# Patient Record
Sex: Female | Born: 2000 | Race: White | Hispanic: No | Marital: Single | Smoking: Never smoker
Health system: Southern US, Community
[De-identification: ages and names within clinical notes are randomized; demographics above are authoritative.]

## PROBLEM LIST (undated history)

## (undated) DIAGNOSIS — F419 Anxiety disorder, unspecified: Secondary | ICD-10-CM

## (undated) HISTORY — DX: Anxiety disorder, unspecified: F41.9

## (undated) HISTORY — PX: WISDOM TOOTH EXTRACTION: SHX21

---
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
## 2018-01-09 DIAGNOSIS — J029 Acute pharyngitis, unspecified: Secondary | ICD-10-CM | POA: Diagnosis not present

## 2018-02-15 DIAGNOSIS — R5383 Other fatigue: Secondary | ICD-10-CM | POA: Diagnosis not present

## 2018-02-15 DIAGNOSIS — J029 Acute pharyngitis, unspecified: Secondary | ICD-10-CM | POA: Diagnosis not present

## 2018-03-29 DIAGNOSIS — Z0000 Encounter for general adult medical examination without abnormal findings: Secondary | ICD-10-CM | POA: Diagnosis not present

## 2018-04-05 DIAGNOSIS — Z0000 Encounter for general adult medical examination without abnormal findings: Secondary | ICD-10-CM | POA: Diagnosis not present

## 2018-06-19 DIAGNOSIS — J069 Acute upper respiratory infection, unspecified: Secondary | ICD-10-CM | POA: Diagnosis not present

## 2018-06-19 DIAGNOSIS — H608X1 Other otitis externa, right ear: Secondary | ICD-10-CM | POA: Diagnosis not present

## 2018-09-26 DIAGNOSIS — F331 Major depressive disorder, recurrent, moderate: Secondary | ICD-10-CM | POA: Diagnosis not present

## 2018-09-28 DIAGNOSIS — N390 Urinary tract infection, site not specified: Secondary | ICD-10-CM | POA: Diagnosis not present

## 2018-10-03 DIAGNOSIS — F331 Major depressive disorder, recurrent, moderate: Secondary | ICD-10-CM | POA: Diagnosis not present

## 2018-10-17 DIAGNOSIS — F410 Panic disorder [episodic paroxysmal anxiety] without agoraphobia: Secondary | ICD-10-CM | POA: Diagnosis not present

## 2018-10-17 DIAGNOSIS — F331 Major depressive disorder, recurrent, moderate: Secondary | ICD-10-CM | POA: Diagnosis not present

## 2018-10-17 DIAGNOSIS — F330 Major depressive disorder, recurrent, mild: Secondary | ICD-10-CM | POA: Diagnosis not present

## 2018-10-17 DIAGNOSIS — F411 Generalized anxiety disorder: Secondary | ICD-10-CM | POA: Diagnosis not present

## 2018-10-24 DIAGNOSIS — F331 Major depressive disorder, recurrent, moderate: Secondary | ICD-10-CM | POA: Diagnosis not present

## 2018-11-02 DIAGNOSIS — F331 Major depressive disorder, recurrent, moderate: Secondary | ICD-10-CM | POA: Diagnosis not present

## 2018-11-07 DIAGNOSIS — F331 Major depressive disorder, recurrent, moderate: Secondary | ICD-10-CM | POA: Diagnosis not present

## 2018-11-13 DIAGNOSIS — F410 Panic disorder [episodic paroxysmal anxiety] without agoraphobia: Secondary | ICD-10-CM | POA: Diagnosis not present

## 2018-11-13 DIAGNOSIS — F411 Generalized anxiety disorder: Secondary | ICD-10-CM | POA: Diagnosis not present

## 2018-11-13 DIAGNOSIS — F330 Major depressive disorder, recurrent, mild: Secondary | ICD-10-CM | POA: Diagnosis not present

## 2018-11-14 DIAGNOSIS — F331 Major depressive disorder, recurrent, moderate: Secondary | ICD-10-CM | POA: Diagnosis not present

## 2018-11-17 DIAGNOSIS — R109 Unspecified abdominal pain: Secondary | ICD-10-CM | POA: Diagnosis not present

## 2018-11-17 DIAGNOSIS — R1030 Lower abdominal pain, unspecified: Secondary | ICD-10-CM | POA: Diagnosis not present

## 2019-02-05 DIAGNOSIS — F419 Anxiety disorder, unspecified: Secondary | ICD-10-CM | POA: Diagnosis not present

## 2019-02-05 DIAGNOSIS — M79671 Pain in right foot: Secondary | ICD-10-CM | POA: Diagnosis not present

## 2019-02-26 DIAGNOSIS — M25571 Pain in right ankle and joints of right foot: Secondary | ICD-10-CM | POA: Diagnosis not present

## 2019-02-27 DIAGNOSIS — M25571 Pain in right ankle and joints of right foot: Secondary | ICD-10-CM | POA: Diagnosis not present

## 2019-03-02 DIAGNOSIS — M25571 Pain in right ankle and joints of right foot: Secondary | ICD-10-CM | POA: Diagnosis not present

## 2019-03-05 DIAGNOSIS — M25571 Pain in right ankle and joints of right foot: Secondary | ICD-10-CM | POA: Diagnosis not present

## 2019-03-08 ENCOUNTER — Other Ambulatory Visit: Payer: Self-pay | Admitting: Obstetrics & Gynecology

## 2019-03-26 DIAGNOSIS — F419 Anxiety disorder, unspecified: Secondary | ICD-10-CM | POA: Diagnosis not present

## 2019-03-26 DIAGNOSIS — Z309 Encounter for contraceptive management, unspecified: Secondary | ICD-10-CM | POA: Diagnosis not present

## 2019-04-13 DIAGNOSIS — Z01419 Encounter for gynecological examination (general) (routine) without abnormal findings: Secondary | ICD-10-CM | POA: Diagnosis not present

## 2019-04-13 DIAGNOSIS — Z118 Encounter for screening for other infectious and parasitic diseases: Secondary | ICD-10-CM | POA: Diagnosis not present

## 2019-04-13 DIAGNOSIS — Z30430 Encounter for insertion of intrauterine contraceptive device: Secondary | ICD-10-CM | POA: Diagnosis not present

## 2019-04-13 DIAGNOSIS — Z975 Presence of (intrauterine) contraceptive device: Secondary | ICD-10-CM | POA: Diagnosis not present

## 2019-04-13 DIAGNOSIS — Z124 Encounter for screening for malignant neoplasm of cervix: Secondary | ICD-10-CM | POA: Diagnosis not present

## 2019-09-21 DIAGNOSIS — F988 Other specified behavioral and emotional disorders with onset usually occurring in childhood and adolescence: Secondary | ICD-10-CM | POA: Diagnosis not present

## 2019-10-16 DIAGNOSIS — H66001 Acute suppurative otitis media without spontaneous rupture of ear drum, right ear: Secondary | ICD-10-CM | POA: Diagnosis not present

## 2019-10-16 DIAGNOSIS — Z23 Encounter for immunization: Secondary | ICD-10-CM | POA: Diagnosis not present

## 2019-10-16 DIAGNOSIS — J0140 Acute pansinusitis, unspecified: Secondary | ICD-10-CM | POA: Diagnosis not present

## 2019-11-09 DIAGNOSIS — F988 Other specified behavioral and emotional disorders with onset usually occurring in childhood and adolescence: Secondary | ICD-10-CM | POA: Diagnosis not present

## 2019-12-21 DIAGNOSIS — Z20828 Contact with and (suspected) exposure to other viral communicable diseases: Secondary | ICD-10-CM | POA: Diagnosis not present

## 2020-02-03 DIAGNOSIS — J029 Acute pharyngitis, unspecified: Secondary | ICD-10-CM | POA: Diagnosis not present

## 2020-02-03 DIAGNOSIS — Z03818 Encounter for observation for suspected exposure to other biological agents ruled out: Secondary | ICD-10-CM | POA: Diagnosis not present

## 2020-02-03 DIAGNOSIS — Z20828 Contact with and (suspected) exposure to other viral communicable diseases: Secondary | ICD-10-CM | POA: Diagnosis not present

## 2020-02-03 DIAGNOSIS — J309 Allergic rhinitis, unspecified: Secondary | ICD-10-CM | POA: Diagnosis not present

## 2020-02-20 DIAGNOSIS — Z0000 Encounter for general adult medical examination without abnormal findings: Secondary | ICD-10-CM | POA: Diagnosis not present

## 2020-02-26 ENCOUNTER — Telehealth: Payer: Self-pay | Admitting: Gastroenterology

## 2020-02-26 DIAGNOSIS — R1012 Left upper quadrant pain: Secondary | ICD-10-CM

## 2020-02-26 NOTE — Telephone Encounter (Signed)
[phone] patient father called inquiring about appointment..  Was unsure what to tell him

## 2020-02-26 NOTE — Telephone Encounter (Signed)
Received faxed copy of front of insurance card. [employer]. Member ID: [id number]. Didn't receive copy of back of new card. Previous [employer] card in chart. Called [employer] number on back of old card: [phone]. No PA needed for outpatient/imaging services.  Called pt, gave her phone number for [facility] to call them if she hasn't heard anything from them in next few days. SLF entered order for GI.

## 2020-02-26 NOTE — Telephone Encounter (Addendum)
REVIEWED. PT SEEN AND EXAMINED. Has lump on abdomen that comes and goes for past 2 mos. It hurts. PT DENIES FEVER, CHILLS, HEMATOCHEZIA, HEMATEMESIS, nausea, vomiting, melena, diarrhea, CHEST PAIN, SHORTNESS OF BREATH,  CHANGE IN BOWEL IN HABITS, constipation, problems swallowing,or heartburn or indigestion.  Pe: mild to moderate tenderness in epigastrium and LUQ, SKIN: NORMAL  PLAN: 1. CT ABDOMEN W/ IV OR ORAL CONTRAST IN [facility] TO ASSESS AREA. PT NEEDS CT ON Friday APR 2 OR APR 7. 2. CONSIDER SURGERY REFERRAL AFTER CT RESULTS KNOWN.

## 2020-02-26 NOTE — Telephone Encounter (Signed)
Called pt, she has insurance. Will fax copy of insurance card to office.

## 2020-02-26 NOTE — Telephone Encounter (Signed)
See other phone note

## 2020-03-13 ENCOUNTER — Other Ambulatory Visit: Payer: Self-pay

## 2020-03-13 ENCOUNTER — Ambulatory Visit
Admission: RE | Admit: 2020-03-13 | Discharge: 2020-03-13 | Disposition: A | Discharge status: Home / Self Care | Payer: BLUE CROSS/BLUE SHIELD | Source: Ambulatory Visit | Attending: Gastroenterology | Admitting: Gastroenterology

## 2020-03-13 DIAGNOSIS — R1012 Left upper quadrant pain: Secondary | ICD-10-CM

## 2020-06-26 DIAGNOSIS — M898X1 Other specified disorders of bone, shoulder: Secondary | ICD-10-CM | POA: Diagnosis not present

## 2020-06-26 DIAGNOSIS — S29012A Strain of muscle and tendon of back wall of thorax, initial encounter: Secondary | ICD-10-CM | POA: Diagnosis not present

## 2021-08-11 IMAGING — CT CT ABDOMEN W/O CM
1 of 2 series · 14 of 32 positions shown, 19 images · non-contrast
Comparison: None.

CLINICAL DATA: Abdominal pain. Left upper quadrant pain evaluate
for hernia.

EXAM:
CT ABDOMEN WITHOUT CONTRAST
TECHNIQUE: Multidetector CT imaging of the abdomen was performed following the
standard protocol without IV contrast.

[Series 2: abd w/(date) · axial · 0.59mm/px · z∈[+687,+897]mm · 14 of 49 slices shown, 19 images]
[im 4/49  soft-tissue]
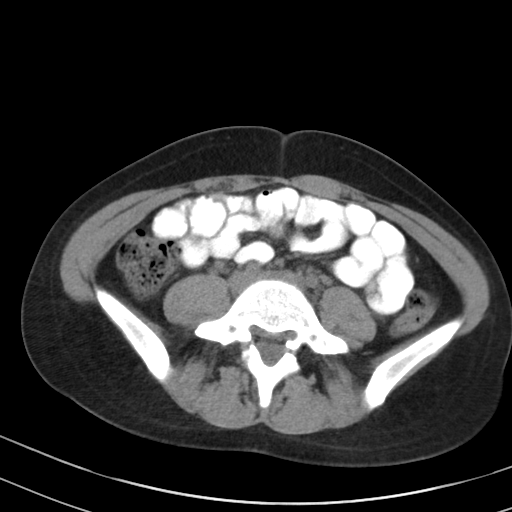
[im 4/49  bone]
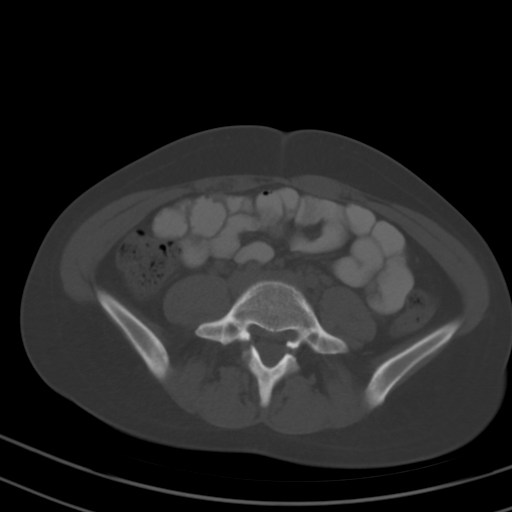
[im 7/49  soft-tissue]
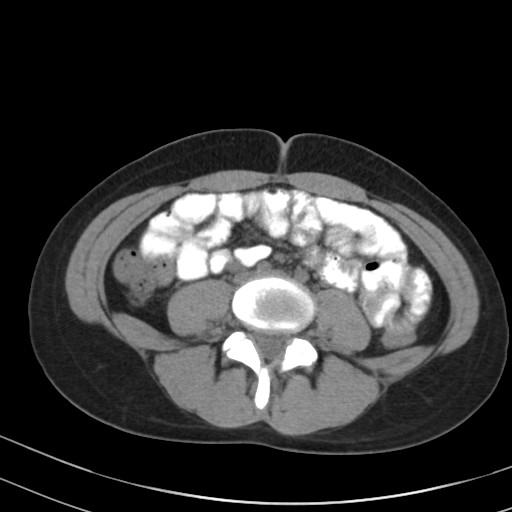
[im 10/49  soft-tissue]
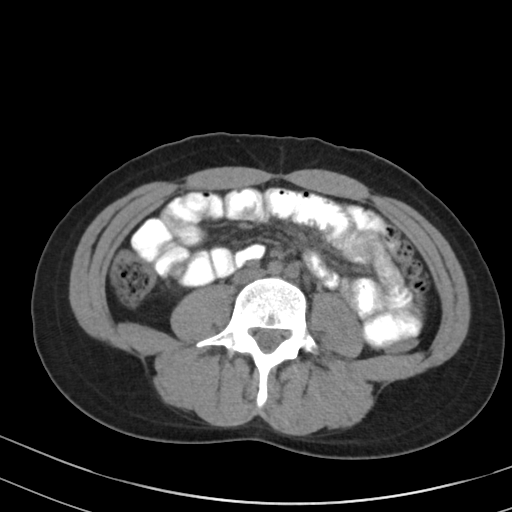
[im 16/49  soft-tissue]
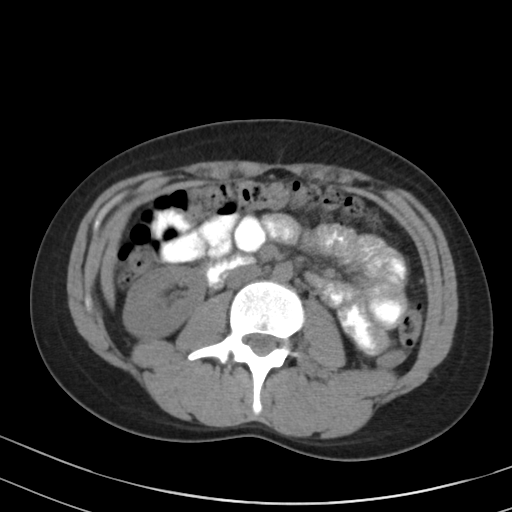
[im 19/49  soft-tissue]
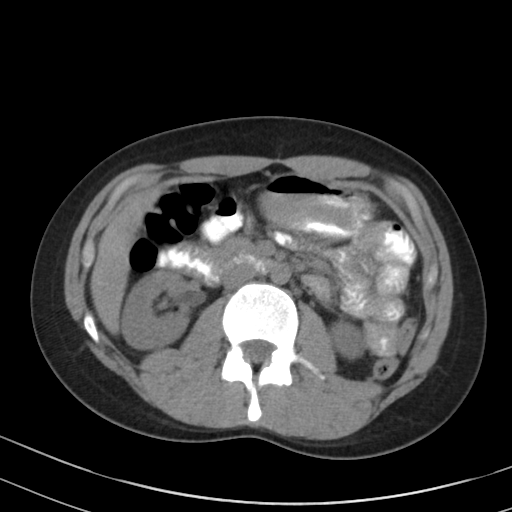
[im 22/49  soft-tissue]
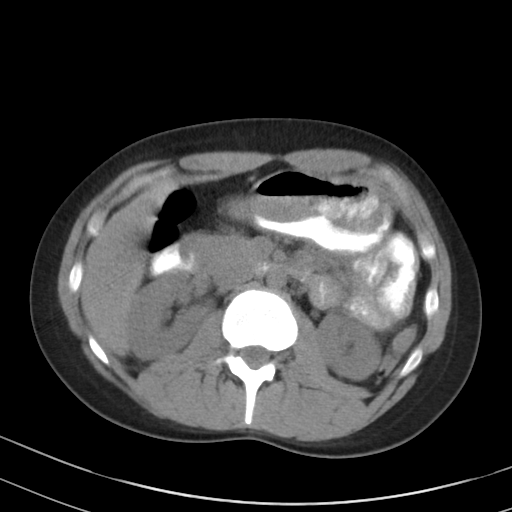
[im 25/49  soft-tissue]
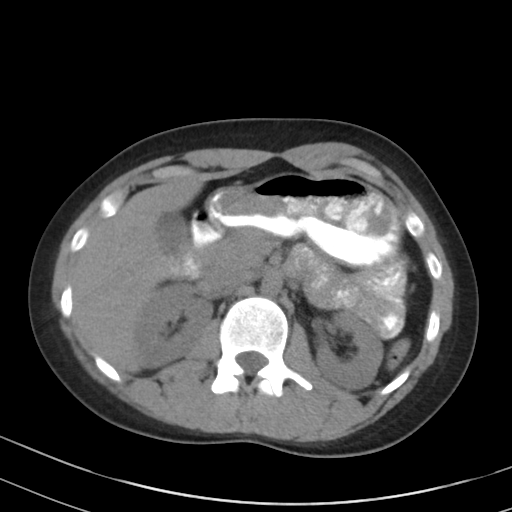
[im 28/49  soft-tissue]
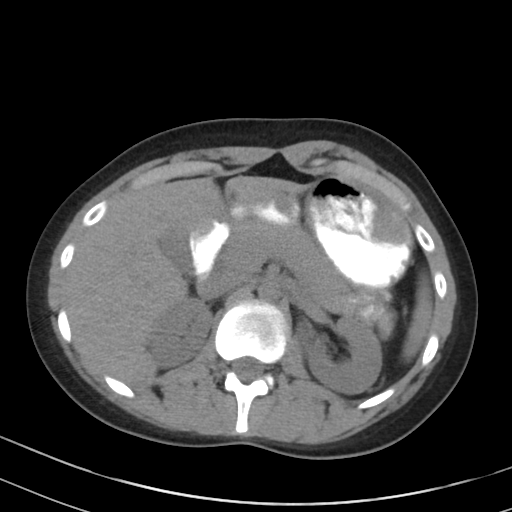
[im 31/49  soft-tissue]
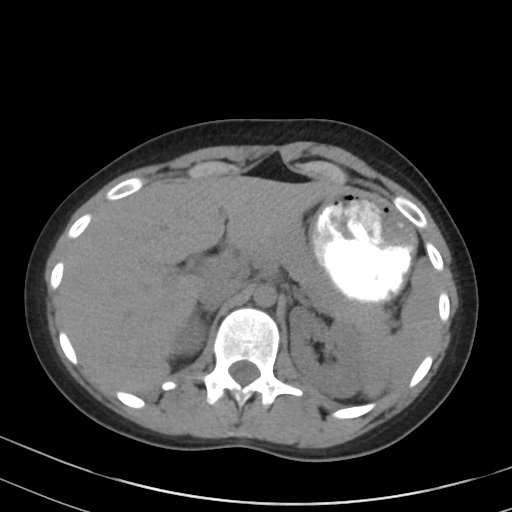
[im 31/49  bone]
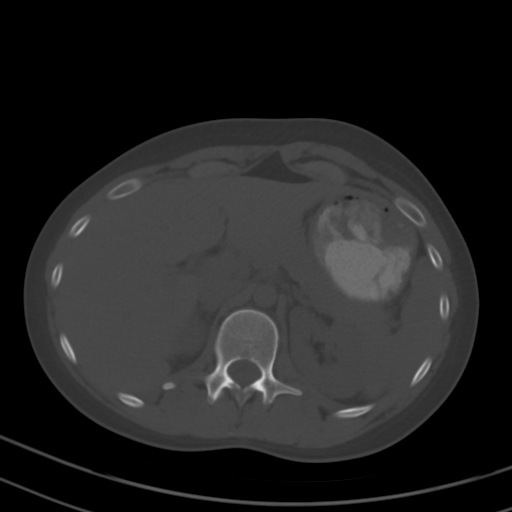
[im 34/49  soft-tissue]
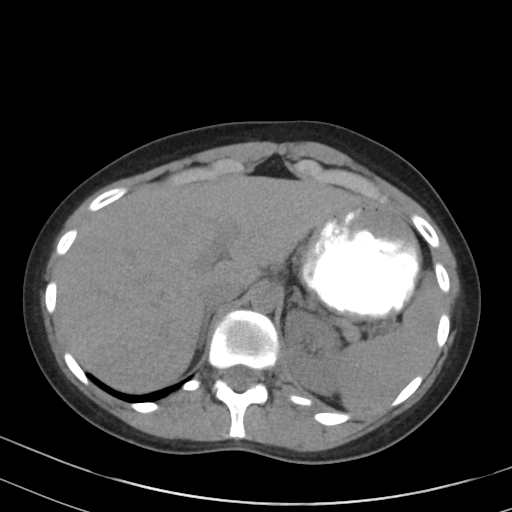
[im 37/49  lung]
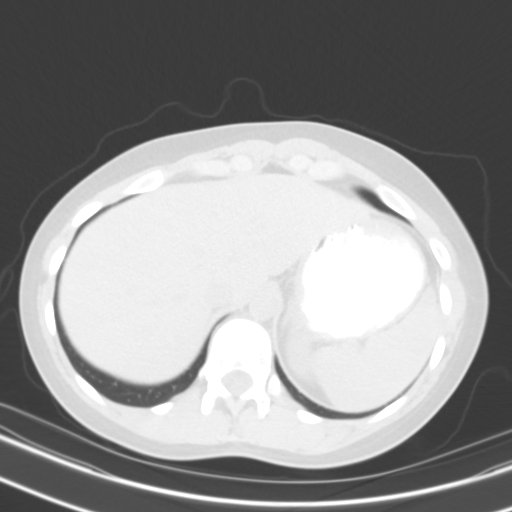
[im 40/49  soft-tissue]
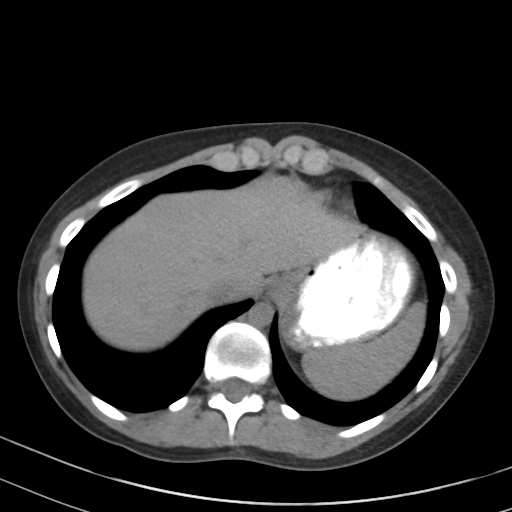
[im 40/49  lung]
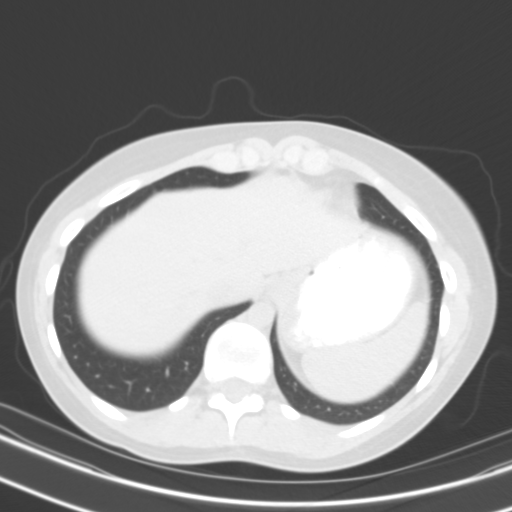
[im 43/49  soft-tissue]
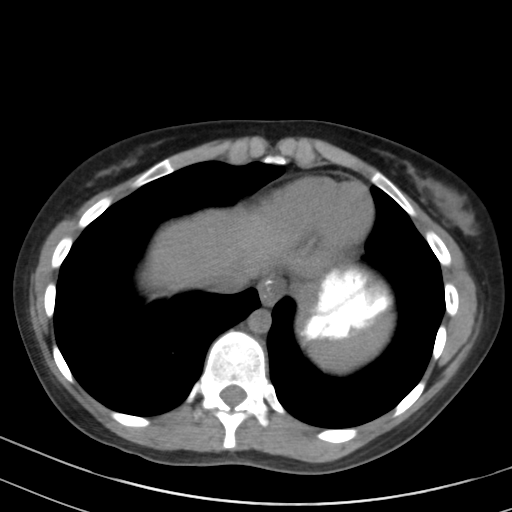
[im 43/49  lung]
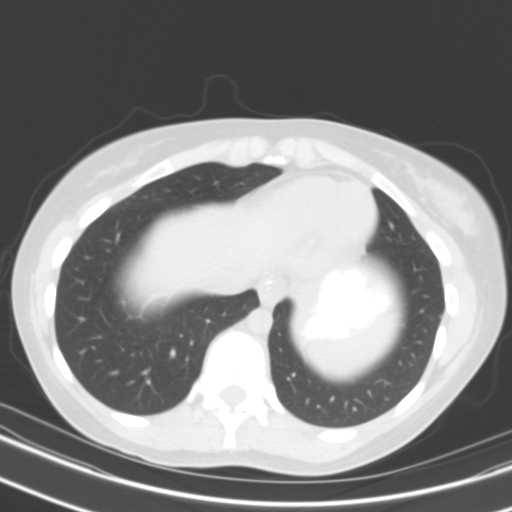
[im 46/49  soft-tissue]
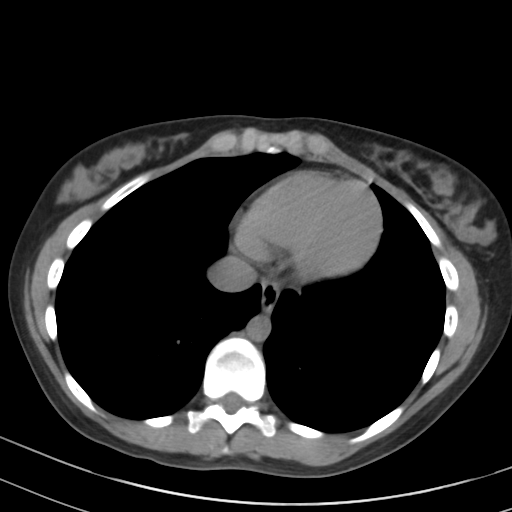
[im 46/49  lung]
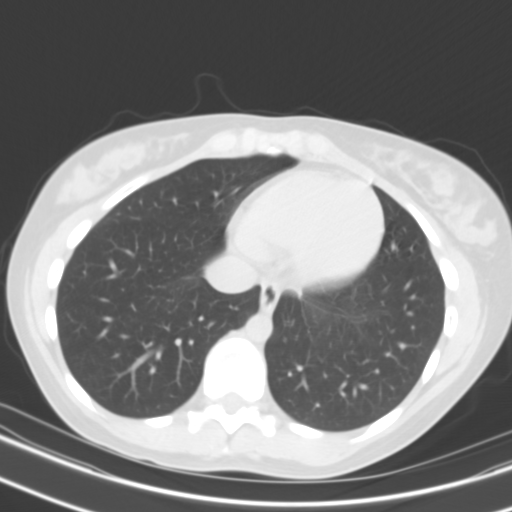

[14 of 32 positions shown; findings below may reference images not displayed]

FINDINGS: Lower chest: No acute abnormality.

Hepatobiliary: No focal liver abnormality is seen. No gallstones,
gallbladder wall thickening, or biliary dilatation.

Pancreas: Unremarkable. No pancreatic ductal dilatation or
surrounding inflammatory changes.

Spleen: Normal in size without focal abnormality.

Adrenals/Urinary Tract: Normal appearance of the adrenal glands. No
kidney stones, mass or hydronephrosis.

Stomach/Bowel: Stomach is within normal limits. No evidence of bowel
wall thickening, distention, or inflammatory changes.

Vascular/Lymphatic: No significant vascular findings are present. No
enlarged abdominal or pelvic lymph nodes.

Other: No free fluid or fluid collections. No abdominal wall hernia.

Musculoskeletal: No acute or significant osseous findings.
IMPRESSION: No acute findings within the abdomen or pelvis. No explanation for
patient's left upper quadrant pain.
